# Patient Record
Sex: Female | Born: 1995 | Race: White | Marital: Single | Smoking: Never smoker
Health system: Southern US, Community
[De-identification: ages and names within clinical notes are randomized; demographics above are authoritative.]

---

## 2015-10-08 ENCOUNTER — Emergency Department: Payer: BLUE CROSS/BLUE SHIELD

## 2015-10-08 ENCOUNTER — Emergency Department
Admission: EM | Admit: 2015-10-08 | Discharge: 2015-10-08 | Disposition: A | Discharge status: Home / Self Care | Payer: BLUE CROSS/BLUE SHIELD | Attending: Emergency Medicine | Admitting: Emergency Medicine

## 2015-10-08 ENCOUNTER — Encounter: Payer: Self-pay | Admitting: Emergency Medicine

## 2015-10-08 DIAGNOSIS — R1031 Right lower quadrant pain: Secondary | ICD-10-CM | POA: Diagnosis present

## 2015-10-08 DIAGNOSIS — R197 Diarrhea, unspecified: Secondary | ICD-10-CM | POA: Insufficient documentation

## 2015-10-08 LAB — URINALYSIS COMPLETE WITH MICROSCOPIC (ARMC ONLY)
Bilirubin Urine: NEGATIVE
Glucose, UA: NEGATIVE mg/dL
HGB URINE DIPSTICK: NEGATIVE
Ketones, ur: NEGATIVE mg/dL
LEUKOCYTES UA: NEGATIVE
NITRITE: NEGATIVE
PH: 6 (ref 5.0–8.0)
Protein, ur: NEGATIVE mg/dL
SPECIFIC GRAVITY, URINE: 1.018 (ref 1.005–1.030)

## 2015-10-08 LAB — COMPREHENSIVE METABOLIC PANEL
ALT: 22 U/L (ref 14–54)
AST: 26 U/L (ref 15–41)
Albumin: 4.1 g/dL (ref 3.5–5.0)
Alkaline Phosphatase: 51 U/L (ref 38–126)
Anion gap: 3 — ABNORMAL LOW (ref 5–15)
BILIRUBIN TOTAL: 0.5 mg/dL (ref 0.3–1.2)
BUN: 11 mg/dL (ref 6–20)
CHLORIDE: 103 mmol/L (ref 101–111)
CO2: 29 mmol/L (ref 22–32)
Calcium: 9.3 mg/dL (ref 8.9–10.3)
Creatinine, Ser: 1.05 mg/dL — ABNORMAL HIGH (ref 0.44–1.00)
GFR calc Af Amer: >60 mL/min (ref >60)
Glucose, Bld: 102 mg/dL — ABNORMAL HIGH (ref 65–99)
POTASSIUM: 3.3 mmol/L — AB (ref 3.5–5.1)
Sodium: 135 mmol/L (ref 135–145)
TOTAL PROTEIN: 7.3 g/dL (ref 6.5–8.1)

## 2015-10-08 LAB — CBC
HEMATOCRIT: 42 % (ref 35.0–47.0)
Hemoglobin: 14.4 g/dL (ref 12.0–16.0)
MCH: 28.3 pg (ref 26.0–34.0)
MCHC: 34.3 g/dL (ref 32.0–36.0)
MCV: 82.7 fL (ref 80.0–100.0)
PLATELETS: 235 10*3/uL (ref 150–440)
RBC: 5.08 MIL/uL (ref 3.80–5.20)
RDW: 14.3 % (ref 11.5–14.5)
WBC: 4.4 10*3/uL (ref 3.6–11.0)

## 2015-10-08 LAB — LIPASE, BLOOD: Lipase: 27 U/L (ref 11–51)

## 2015-10-08 MED ORDER — DIATRIZOATE MEGLUMINE & SODIUM 66-10 % PO SOLN
15.0000 mL | ORAL | Status: AC
  Administered 2015-10-08: 15 mL via ORAL

## 2015-10-08 MED ORDER — IBUPROFEN 200 MG PO TABS: 600.0000 mg | ORAL_TABLET | Freq: Four times a day (QID) | ORAL | Status: AC | PRN

## 2015-10-08 MED ORDER — IOPAMIDOL (ISOVUE-300) INJECTION 61%
75.0000 mL | Freq: Once | INTRAVENOUS | Status: AC | PRN
  Administered 2015-10-08: 75 mL via INTRAVENOUS

## 2015-10-08 NOTE — ED Notes (Signed)
Pt verbalized understanding of discharge instructions. NAD at this time. 

## 2015-10-08 NOTE — ED Provider Notes (Addendum)
[facility] Emergency Department Provider Note  ____________________________________________  Time seen: 9:05 AM  I have reviewed the triage vital signs and the nursing notes.   HISTORY  Chief Complaint Diarrhea and Abdominal Pain    HPI Maria Cisneros is a 20 y.o. female who complains of abdominal pain for the past 3-4 days. It started periumbilical and then last 24 hours has migrated to the right lower quadrant. Sometimes feels it in her right lower back as well. Has nausea, decreased appetite, and several episodes of diarrhea per day. Seems to be worse with movements and change in position. No fevers or chills. No vomiting.Pain is constant aching with intermittent colicky sharp severe pain 2.  No dysuria frequency urgency. No vaginal bleeding or discharge. Last [place]. Was March 27, normal timing. On oral birth control pills.   History reviewed. No pertinent past medical history.   There are no active problems to display for this patient.    History reviewed. No pertinent past surgical history.   Current Outpatient Rx  Name  Route  Sig  Dispense  Refill  . Norgestimate-Ethinyl Estradiol Triphasic 0.18/0.215/0.25 MG-35 MCG tablet   Oral   Take 1 tablet by mouth daily.         Marland Kitchen ibuprofen (MOTRIN IB) 200 MG tablet   Oral   Take 3 tablets (600 mg total) by mouth every 6 (six) hours as needed.   60 tablet   0      Allergies Review of patient's allergies indicates no known allergies.   No family history on file.  Social History Social History  Substance Use Topics  . Smoking status: Never Smoker   . Smokeless tobacco: None  . Alcohol Use: No    Review of Systems  Constitutional:   No fever or chills. No weight changes Eyes:   No vision changes.  ENT:   No sore throat. No rhinorrhea. Cardiovascular:   No chest pain. Respiratory:   No dyspnea or cough. Gastrointestinal:   Abdominal pain and diarrhea as above, no vomiting.Marland Kitchen  No  BRBPR or melena. Genitourinary:   Negative for dysuria or difficulty urinating. Musculoskeletal:   Negative for focal pain or swelling Skin:   Negative for rash. Neurological:   Negative for headaches, focal weakness or numbness.  10-point ROS otherwise negative.  ____________________________________________   PHYSICAL EXAM:  VITAL SIGNS: ED Triage Vitals  Enc Vitals Group     BP 10/08/15 0844 117/83 mmHg     Pulse Rate 10/08/15 0844 96     Resp 10/08/15 0844 18     Temp 10/08/15 0844 98.2 F (36.8 C)     Temp Source 10/08/15 0844 Oral     SpO2 10/08/15 0844 100 %     Weight 10/08/15 0844 117 lb (53.071 kg)     Height 10/08/15 0844  (1.626 m)     Head Cir --      Peak Flow --      Pain Score 10/08/15 0844 6     Pain Loc --      Pain Edu? --      Excl. in GC? --     Vital signs reviewed, nursing assessments reviewed.   Constitutional:   Alert and oriented. Well appearing and in no distress. Eyes:   No scleral icterus. No conjunctival pallor. PERRL. EOMI ENT   Head:   Normocephalic and atraumatic.   Nose:   No congestion/rhinnorhea. No septal hematoma   Mouth/Throat:   MMM,  no pharyngeal erythema. No peritonsillar mass.    Neck:   No stridor. No SubQ emphysema. No meningismus. Hematological/Lymphatic/Immunilogical:   No cervical lymphadenopathy. Cardiovascular:   RRR. Symmetric bilateral radial and DP pulses.  No murmurs.  Respiratory:   Normal respiratory effort without tachypnea nor retractions. Breath sounds are clear and equal bilaterally. No wheezes/rales/rhonchi. Gastrointestinal:   Soft With right lower quadrant tenderness at McBurney's point. Positive Rovsing sign.. Non distended. There is no CVA tenderness.  No rebound, rigidity, or guarding. Genitourinary:   deferred Musculoskeletal:   Nontender with normal range of motion in all extremities. No joint effusions.  No lower extremity tenderness.  No edema. Neurologic:   Normal speech and  language.  CN 2-10 normal. Motor grossly intact. No gross focal neurologic deficits are appreciated.  Skin:    Skin is warm, dry and intact. No rash noted.  No petechiae, purpura, or bullae. Psychiatric:   Mood and affect are normal. ____________________________________________    LABS (pertinent positives/negatives) (all labs ordered are listed, but only abnormal results are displayed) Labs Reviewed  COMPREHENSIVE METABOLIC PANEL - Abnormal; Notable for the following:    Potassium 3.3 (*)    Glucose, Bld 102 (*)    Creatinine, Ser 1.05 (*)    Anion gap 3 (*)    All other components within normal limits  URINALYSIS COMPLETEWITH MICROSCOPIC ([facility] ONLY) - Abnormal; Notable for the following:    Color, Urine YELLOW (*)    APPearance HAZY (*)    Bacteria, UA RARE (*)    Squamous Epithelial / LPF 6-30 (*)    All other components within normal limits  LIPASE, BLOOD  CBC  POC URINE PREG, ED   ____________________________________________   EKG    ____________________________________________    RADIOLOGY  CT abdomen pelvis unremarkable Ultrasound pelvis with Doppler is unremarkable.  ____________________________________________   PROCEDURES   ____________________________________________   INITIAL IMPRESSION / ASSESSMENT AND PLAN / ED COURSE  Pertinent labs & imaging results that were available during my care of the patient were reviewed by me and considered in my medical decision making (see chart for details).  Patient well appearing no acute distress. Calm and comfortable and smiling. Exam and symptoms are concerning for appendicitis, certainly proceed with CT scan of the abdomen and pelvis. This was unremarkable. Labs unremarkable. Repeat abdominal exam at 12:40 PM showed persistent severe right lower quadrant tenderness. Proceeded with ultrasound. No evidence of torsion or ovarian cyst. Patient states this is happened before 5 years ago with a negative workup. She  is very well-appearing still. Counseled on possible early appendicitis although this for 3 or 4 days expect something to show on the imaging by now. We'll discharge her home take NSAIDs. Possible muscle skeletal.     ____________________________________________   FINAL CLINICAL IMPRESSION(S) / ED DIAGNOSES  Final diagnoses:  Colicky RLQ abdominal pain  RLQ abdominal pain      Sharman CheekPhillip Nevaya [place], MD 10/08/15 1520  Sharman CheekPhillip Caylor [place], MD 10/08/15 1525

## 2015-10-08 NOTE — ED Notes (Signed)
Pt presents with abd pain diarrhea since Sunday.

## 2015-10-08 NOTE — Discharge Instructions (Signed)

## 2017-08-09 IMAGING — US US TRANSVAGINAL NON-OB
1 series · 13 of 25 positions shown · non-contrast
Comparison: CT scan of same day.

CLINICAL DATA: Acute right-sided pelvic pain.

EXAM:
TRANSABDOMINAL AND TRANSVAGINAL ULTRASOUND OF PELVIS
DOPPLER ULTRASOUND OF OVARIES
TECHNIQUE: Both transabdominal and transvaginal ultrasound examinations of the
pelvis were performed. Transabdominal technique was performed for
global imaging of the pelvis including uterus, ovaries, adnexal
regions, and pelvic cul-de-sac.
It was necessary to proceed with endovaginal exam following the
transabdominal exam to visualize the endometrium and ovaries. Color
and duplex Doppler ultrasound was utilized to evaluate blood flow to
the ovaries.

[Series 1: us transvaginal non-ob · 0.20mm/px · 13 of 86 slices shown]
[im 1/86]
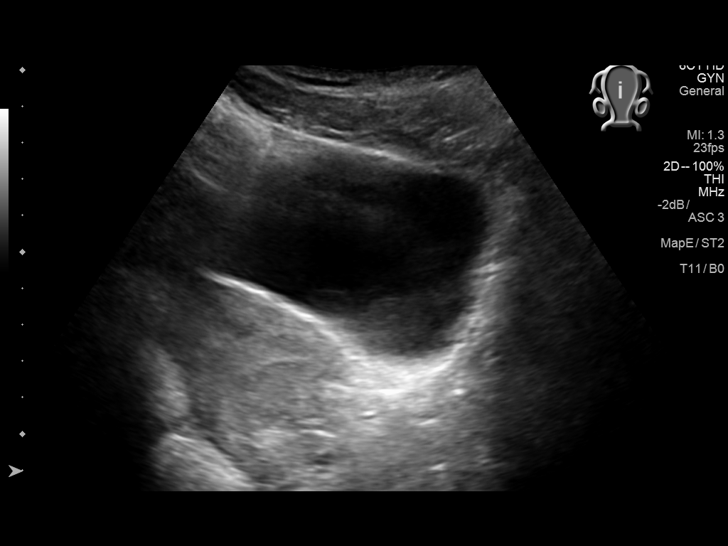
[im 8/86]
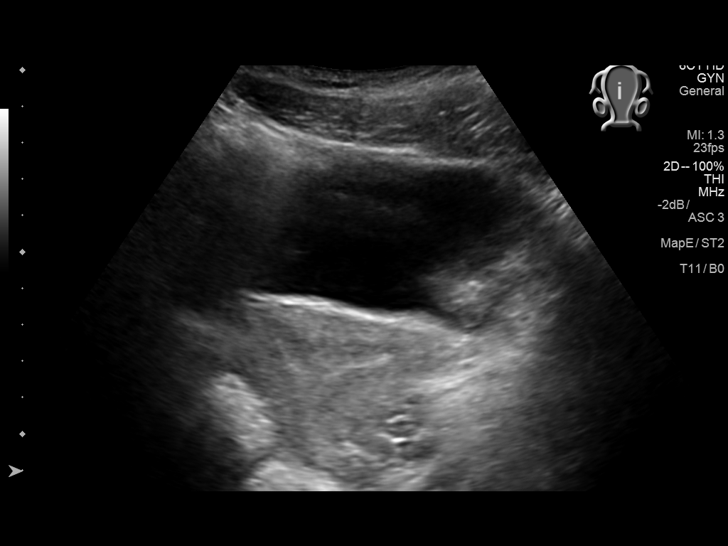
[im 15/86]
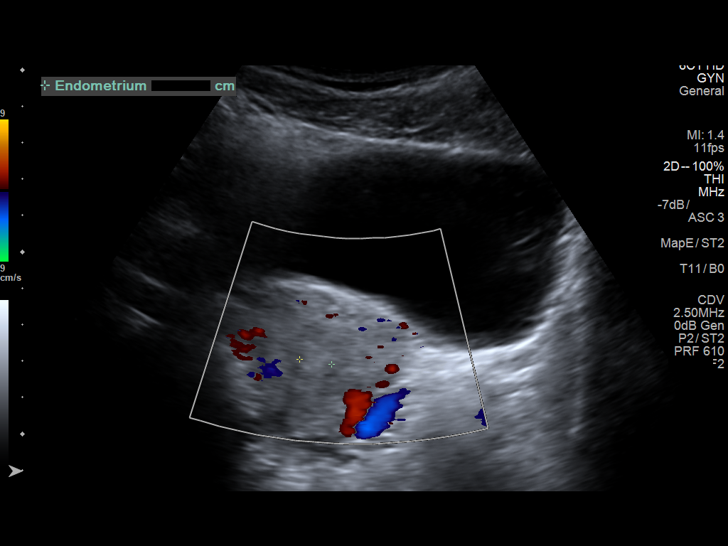
[im 22/86]
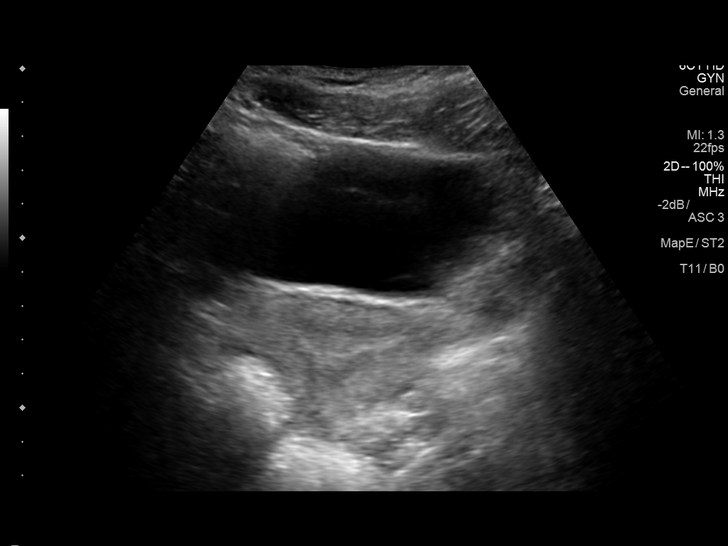
[im 29/86]
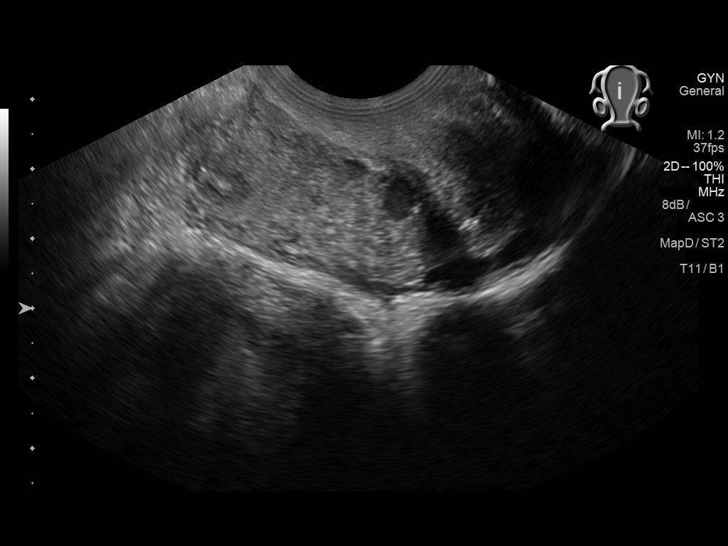
[im 36/86]
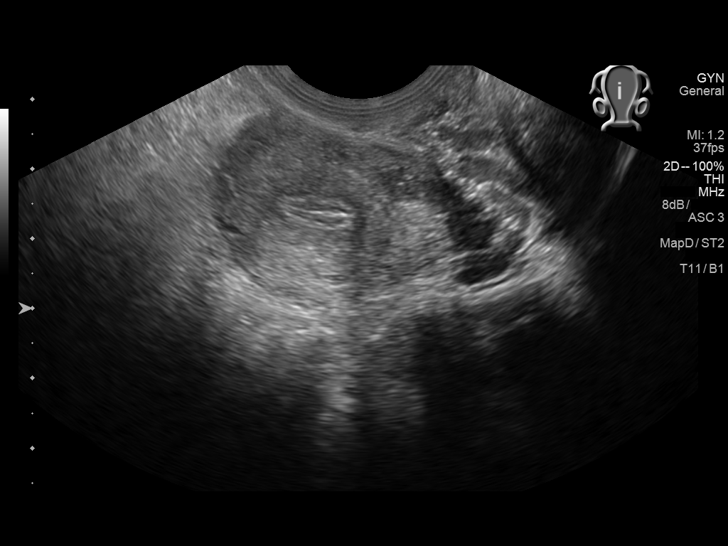
[im 43/86]
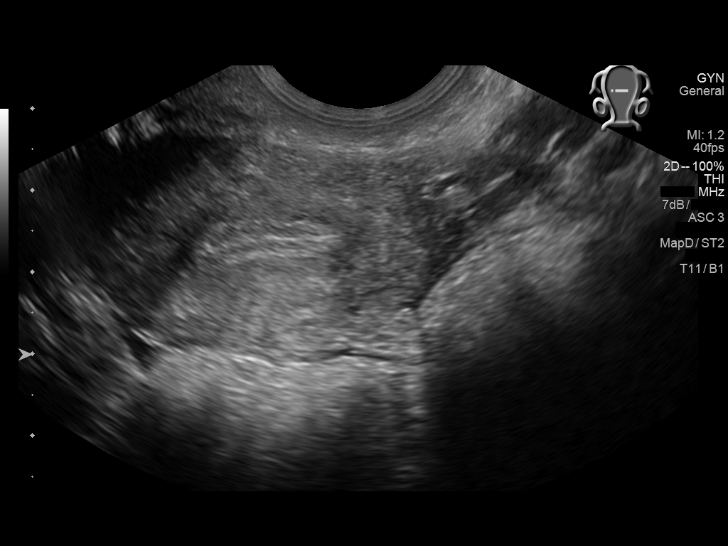
[im 50/86]
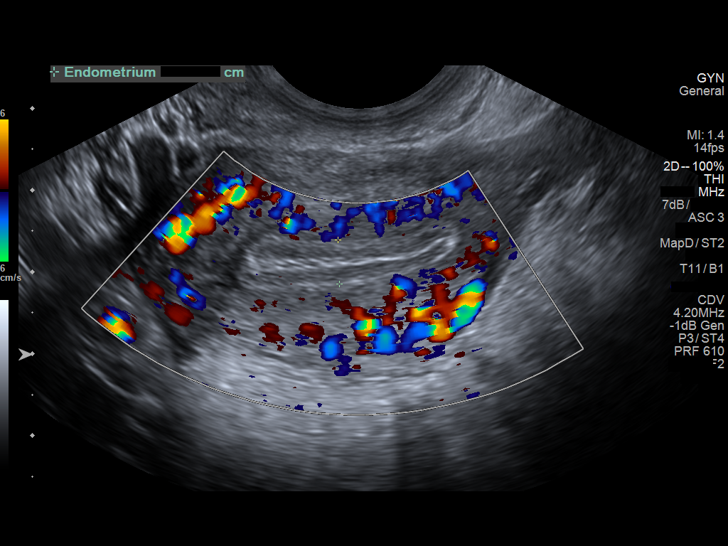
[im 57/86]
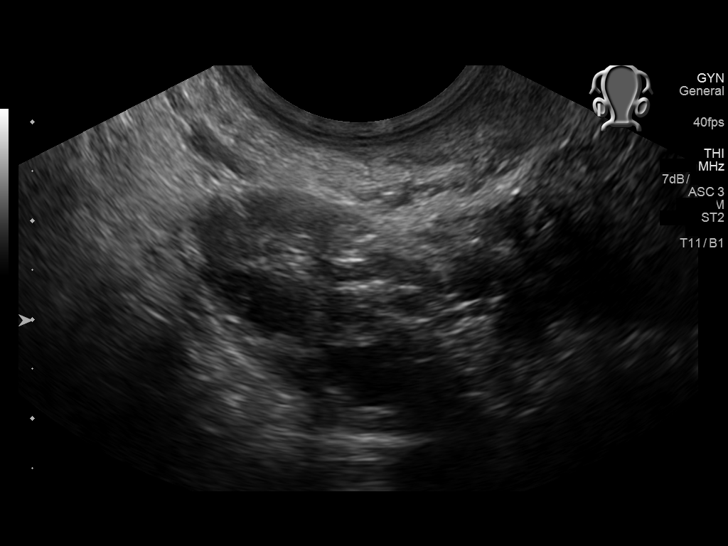
[im 64/86]
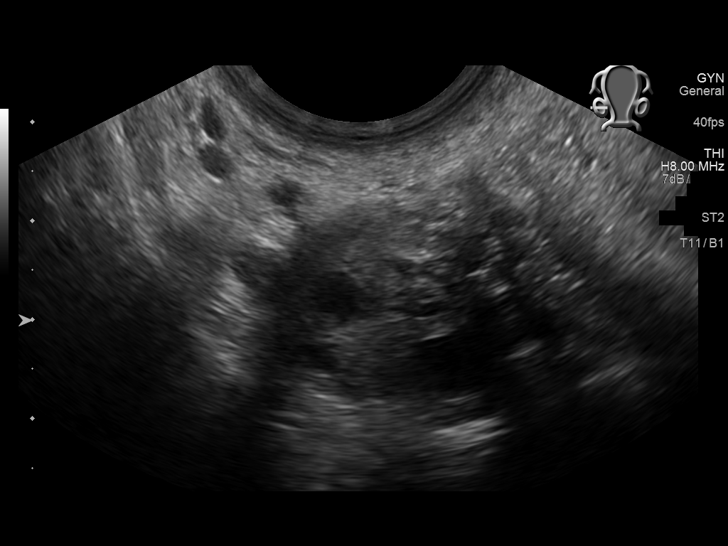
[im 71/86]
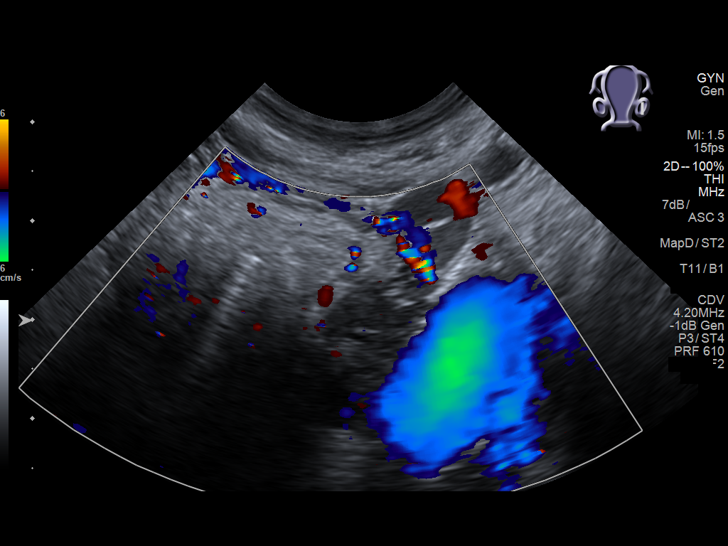
[im 78/86]
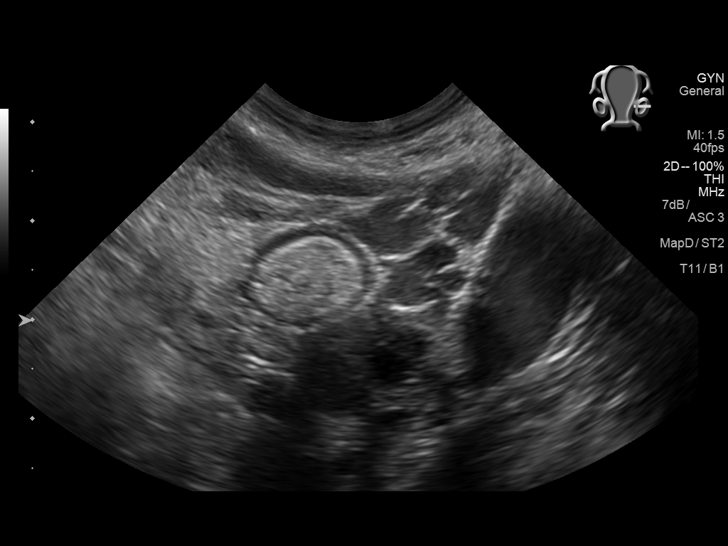
[im 86/86]
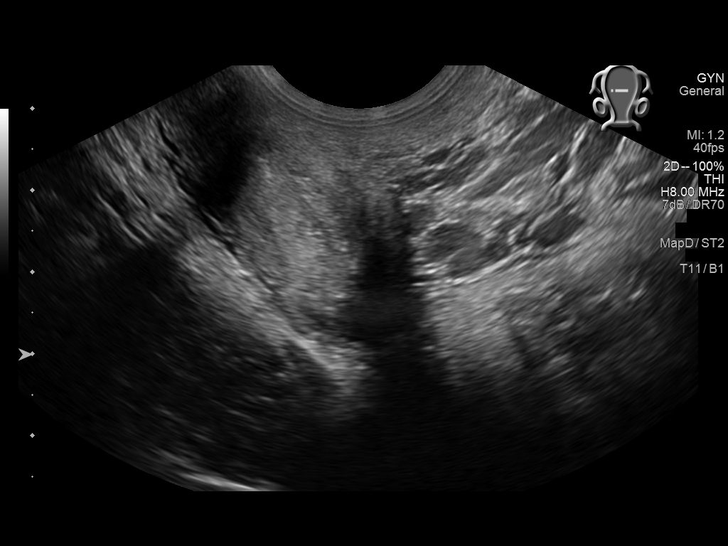

[13 of 25 positions shown; findings below may reference images not displayed]

FINDINGS: Uterus

Measurements: 6.0 x 4.8 x 2.7 cm. No fibroids or other mass
visualized.

Endometrium

Thickness: 5.5 mm which is within normal limits for reproductive
age. No focal abnormality visualized.

Right ovary

Measurements: 2.9 x 2.5 x 1.8 cm. Normal appearance/no adnexal mass.

Left ovary

Measurements: 1.6 x 1.3 x 1.0 cm. Normal appearance/no adnexal mass.

Pulsed Doppler evaluation of both ovaries demonstrates normal
low-resistance arterial and venous waveforms.

Other findings

No abnormal free fluid.
IMPRESSION: No abnormality seen in the pelvis.

## 2017-10-05 IMAGING — CT CT ABD-PELV W/ CM
1 of 2 series · 15 of 32 positions shown, 19 images · IV contrast (iopamidol)
Comparison: None.

CLINICAL DATA: Right lower quadrant pain with tenderness at
McBurney's point.

EXAM:
CT ABDOMEN AND PELVIS WITH CONTRAST
TECHNIQUE: Multidetector CT imaging of the abdomen and pelvis was performed
using the standard protocol following bolus administration of
intravenous contrast.
CONTRAST:  75mL E0NMCA-RXX IOPAMIDOL (E0NMCA-RXX) INJECTION 61%

[Series 2: routine abd pel with · axial · 0.61mm/px · z∈[-862,-452]mm · 15 of 90 slices shown, 19 images]
[im 4/90  soft-tissue]
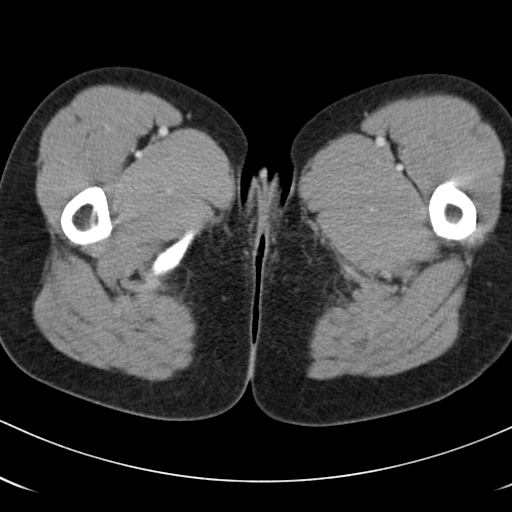
[im 4/90  bone]
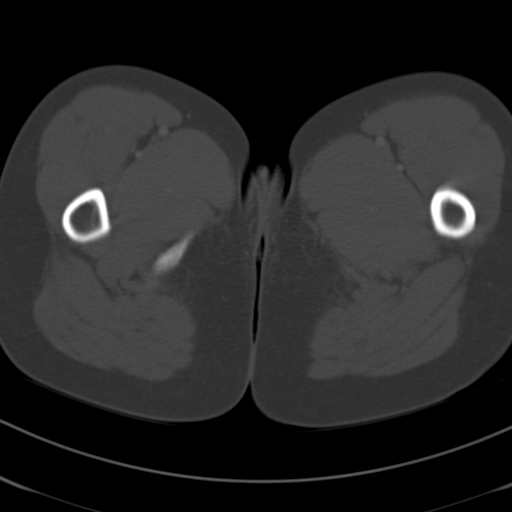
[im 11/90  soft-tissue]
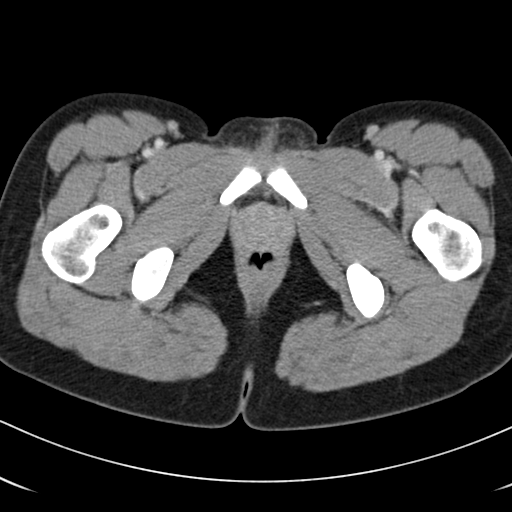
[im 18/90  soft-tissue]
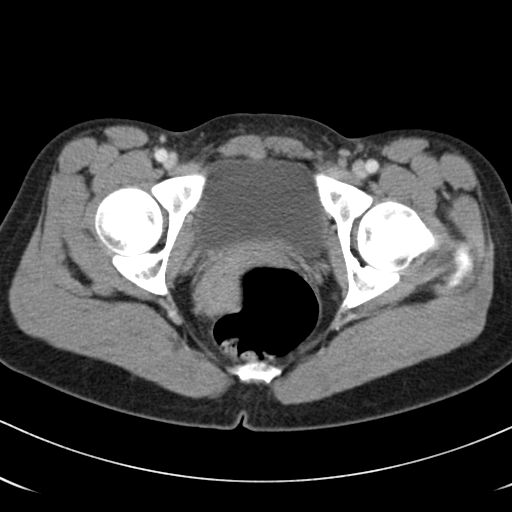
[im 25/90  soft-tissue]
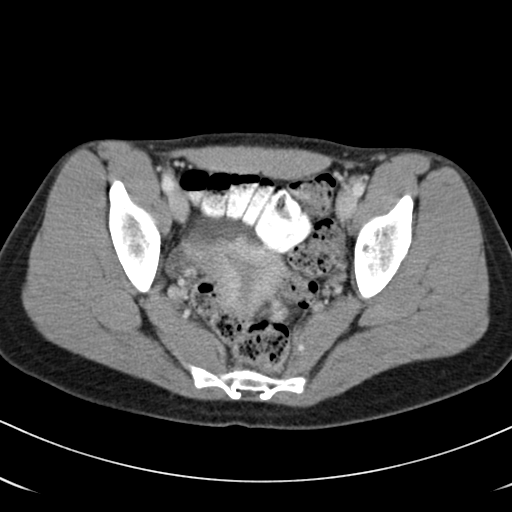
[im 33/90  soft-tissue]
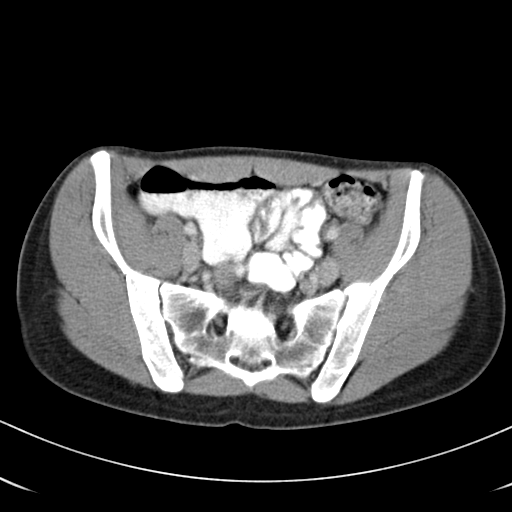
[im 40/90  soft-tissue]
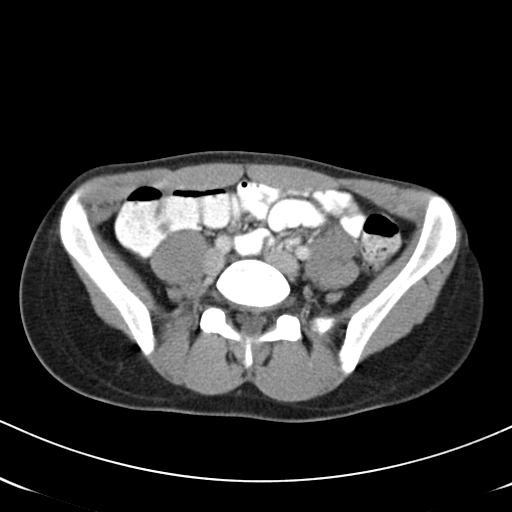
[im 47/90  soft-tissue]
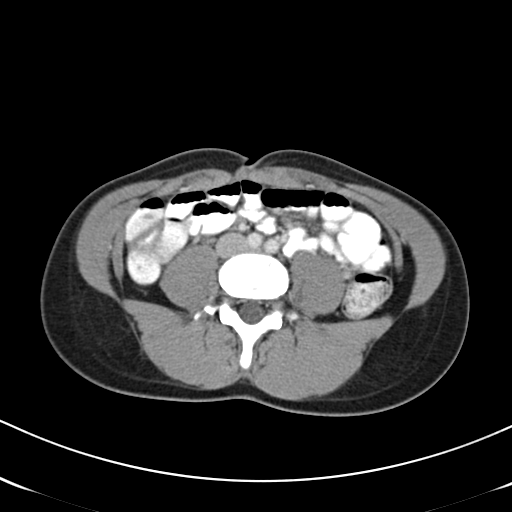
[im 50/90  soft-tissue]
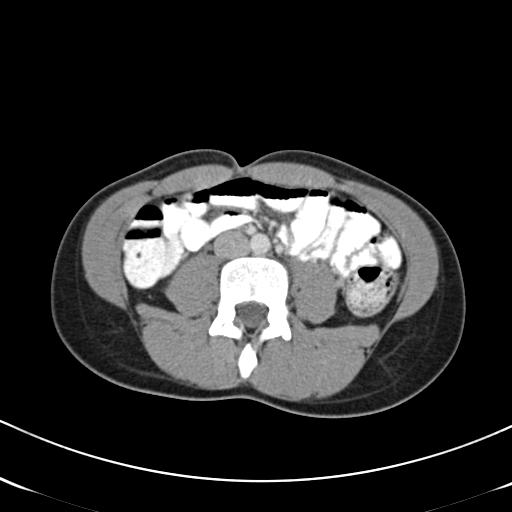
[im 57/90  soft-tissue]
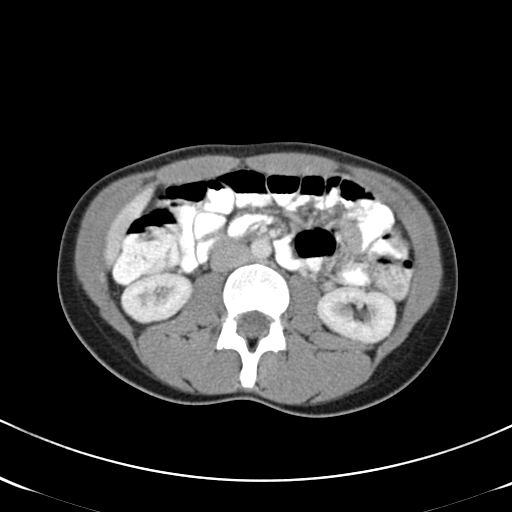
[im 57/90  bone]
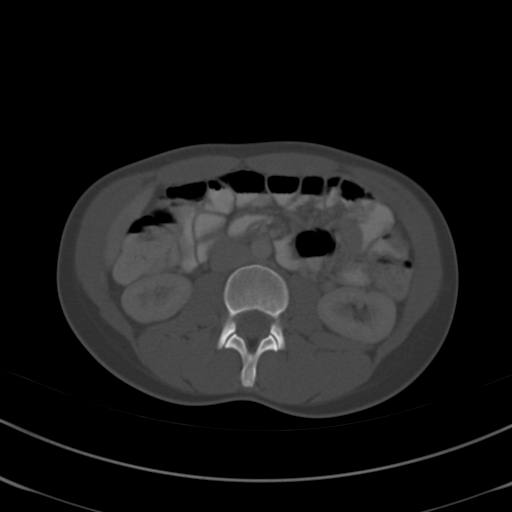
[im 65/90  soft-tissue]
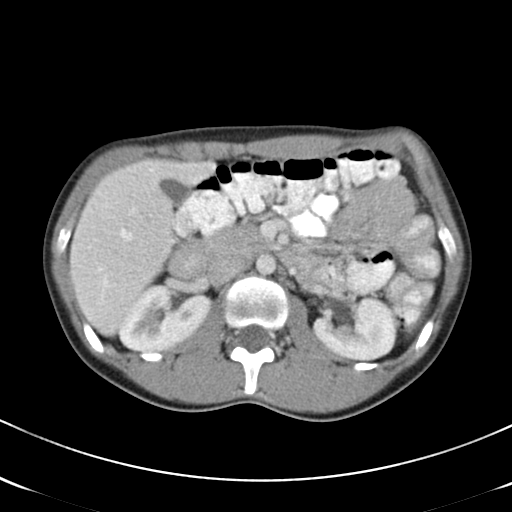
[im 72/90  soft-tissue]
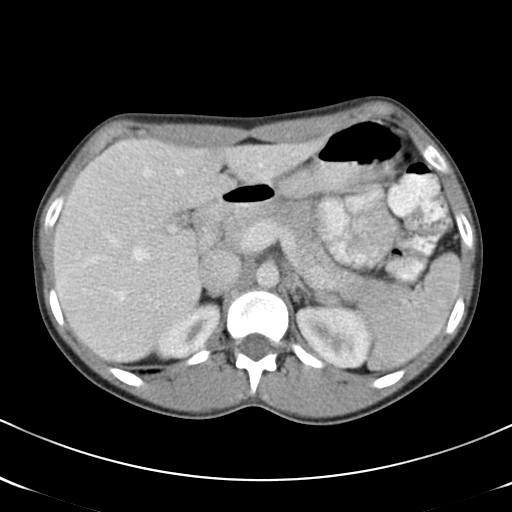
[im 75/90  lung]
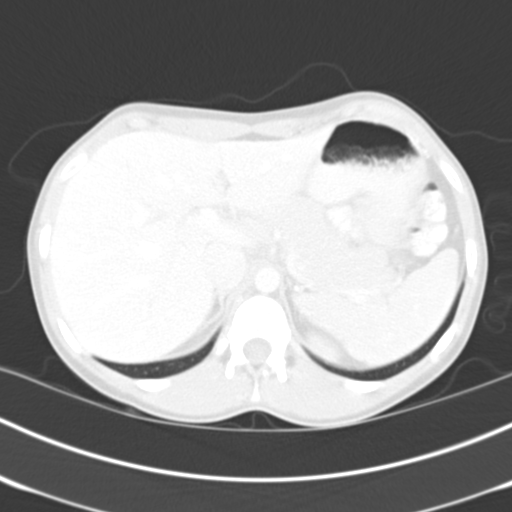
[im 79/90  soft-tissue]
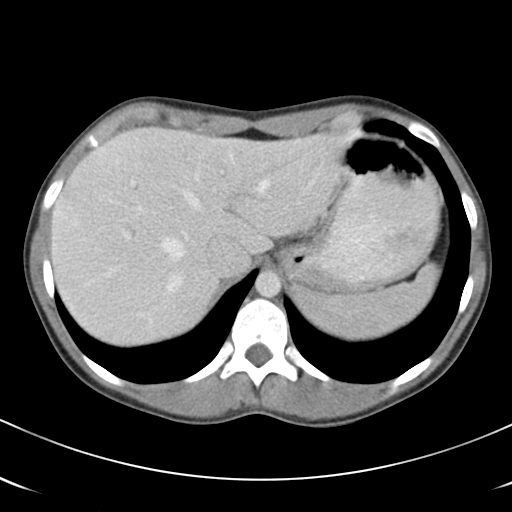
[im 79/90  lung]
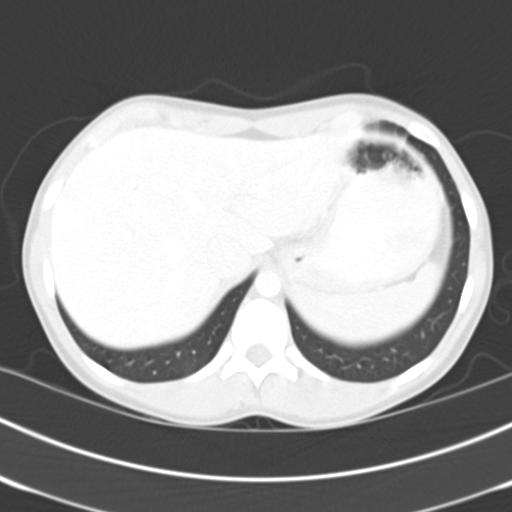
[im 82/90  lung]
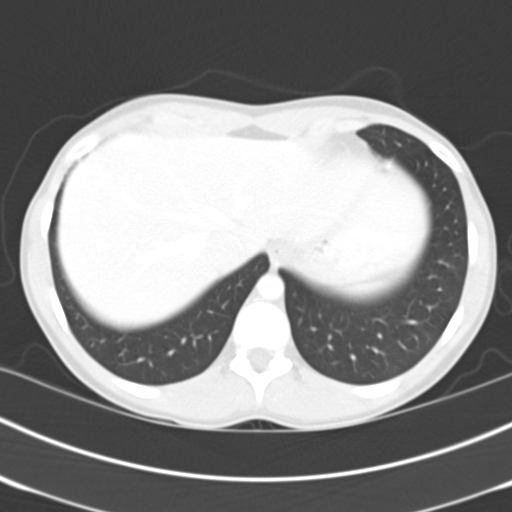
[im 86/90  soft-tissue]
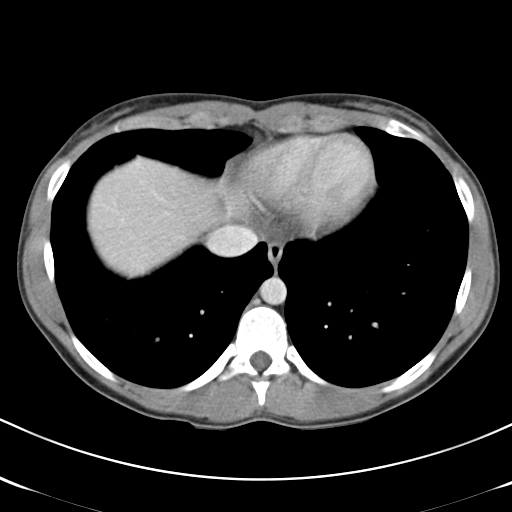
[im 86/90  lung]
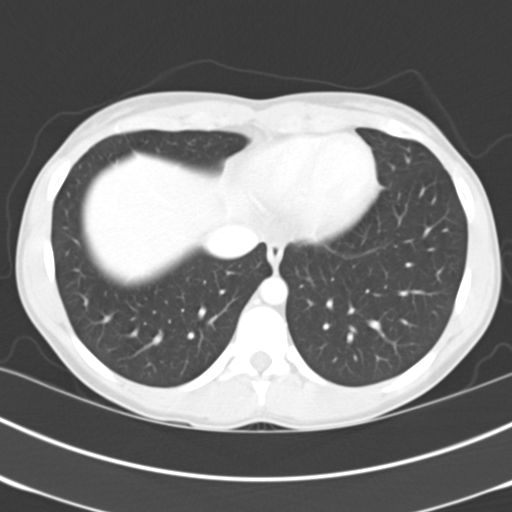

[15 of 32 positions shown; findings below may reference images not displayed]

FINDINGS: Lower chest and abdominal wall:  No contributory findings.

Hepatobiliary: No focal liver abnormality.No evidence of biliary
obstruction or stone.

Pancreas: Unremarkable.

Spleen: Unremarkable.

Adrenals/Urinary Tract: Negative adrenals. No hydronephrosis or
stone. Unremarkable bladder.

Reproductive:No pathologic findings.

Stomach/Bowel:  No obstruction. No appendicitis.

Vascular/Lymphatic: No acute vascular abnormality. No mass or
adenopathy.

Peritoneal: No ascites or pneumoperitoneum.

Musculoskeletal: No acute abnormalities.
IMPRESSION: No appendicitis or other explanation for abdominal pain.
